# Patient Record
Sex: Female | Born: 1955
Health system: Southern US, Community
[De-identification: ages and names within clinical notes are randomized; demographics above are authoritative.]

## PROBLEM LIST (undated history)

## (undated) HISTORY — PX: TONSILLECTOMY: SUR1361

## (undated) HISTORY — PX: ADENOIDECTOMY: SUR15

---

## 1997-09-21 ENCOUNTER — Other Ambulatory Visit: Admission: RE | Admit: 1997-09-21 | Discharge: 1997-09-21 | Payer: Self-pay | Admitting: Obstetrics and Gynecology

## 2007-07-19 ENCOUNTER — Emergency Department (HOSPITAL_COMMUNITY): Admission: EM | Admit: 2007-07-19 | Discharge: 2007-07-19 | Payer: Self-pay | Admitting: Emergency Medicine

## 2013-01-14 ENCOUNTER — Encounter (HOSPITAL_COMMUNITY): Payer: Self-pay | Admitting: Emergency Medicine

## 2013-01-14 ENCOUNTER — Emergency Department (HOSPITAL_COMMUNITY)
Admission: EM | Admit: 2013-01-14 | Discharge: 2013-01-15 | Disposition: A | Discharge status: Home / Self Care | Payer: 59 | Attending: Emergency Medicine | Admitting: Emergency Medicine

## 2013-01-14 ENCOUNTER — Emergency Department (HOSPITAL_COMMUNITY): Payer: 59

## 2013-01-14 DIAGNOSIS — Z79899 Other long term (current) drug therapy: Secondary | ICD-10-CM | POA: Insufficient documentation

## 2013-01-14 DIAGNOSIS — N10 Acute tubulo-interstitial nephritis: Secondary | ICD-10-CM

## 2013-01-14 DIAGNOSIS — R109 Unspecified abdominal pain: Secondary | ICD-10-CM

## 2013-01-14 DIAGNOSIS — N12 Tubulo-interstitial nephritis, not specified as acute or chronic: Secondary | ICD-10-CM | POA: Insufficient documentation

## 2013-01-14 DIAGNOSIS — R110 Nausea: Secondary | ICD-10-CM | POA: Insufficient documentation

## 2013-01-14 DIAGNOSIS — R011 Cardiac murmur, unspecified: Secondary | ICD-10-CM | POA: Insufficient documentation

## 2013-01-14 LAB — URINALYSIS, ROUTINE W REFLEX MICROSCOPIC
Bilirubin Urine: NEGATIVE
Hgb urine dipstick: NEGATIVE
Specific Gravity, Urine: 1.011 (ref 1.005–1.030)
Urobilinogen, UA: 0.2 mg/dL (ref 0.0–1.0)

## 2013-01-14 LAB — COMPREHENSIVE METABOLIC PANEL
Alkaline Phosphatase: 84 U/L (ref 39–117)
BUN: 13 mg/dL (ref 6–23)
Chloride: 104 mEq/L (ref 96–112)
GFR calc Af Amer: >90 mL/min (ref >90)
GFR calc non Af Amer: >90 mL/min (ref >90)
Glucose, Bld: 95 mg/dL (ref 70–99)
Potassium: 3.6 mEq/L (ref 3.5–5.1)
Total Bilirubin: 0.7 mg/dL (ref 0.3–1.2)
Total Protein: 7.4 g/dL (ref 6.0–8.3)

## 2013-01-14 LAB — CBC WITH DIFFERENTIAL/PLATELET
HCT: 38.2 % (ref 36.0–46.0)
MCV: 82.3 fL (ref 78.0–100.0)
RDW: 13 % (ref 11.5–15.5)
WBC: 5.7 10*3/uL (ref 4.0–10.5)

## 2013-01-14 LAB — URINE MICROSCOPIC-ADD ON

## 2013-01-14 MED ORDER — CIPROFLOXACIN HCL 500 MG PO TABS: 500.0000 mg | ORAL_TABLET | Freq: Two times a day (BID) | ORAL | Status: AC

## 2013-01-14 MED ORDER — DEXTROSE 5 % IV SOLN
1.0000 g | Freq: Once | INTRAVENOUS | Status: AC
  Administered 2013-01-15: 1 g via INTRAVENOUS
  Filled 2013-01-14: qty 10

## 2013-01-14 MED ORDER — ONDANSETRON HCL 4 MG/2ML IJ SOLN
4.0000 mg | Freq: Once | INTRAMUSCULAR | Status: AC
  Administered 2013-01-14: 4 mg via INTRAVENOUS
  Filled 2013-01-14: qty 2

## 2013-01-14 MED ORDER — ONDANSETRON 4 MG PO TBDP: 4.0000 mg | ORAL_TABLET | Freq: Three times a day (TID) | ORAL | Status: AC | PRN

## 2013-01-14 MED ORDER — PROMETHAZINE HCL 25 MG RE SUPP: 25.0000 mg | Freq: Four times a day (QID) | RECTAL | Status: AC | PRN

## 2013-01-14 MED ORDER — MORPHINE SULFATE 2 MG/ML IJ SOLN
2.0000 mg | Freq: Once | INTRAMUSCULAR | Status: AC
  Administered 2013-01-14: 1 mg via INTRAVENOUS
  Filled 2013-01-14: qty 1

## 2013-01-14 MED ORDER — SODIUM CHLORIDE 0.9 % IV BOLUS (SEPSIS)
1000.0000 mL | Freq: Once | INTRAVENOUS | Status: AC
  Administered 2013-01-14: 1000 mL via INTRAVENOUS

## 2013-01-14 MED ORDER — HYDROCODONE-ACETAMINOPHEN 5-325 MG PO TABS: 2.0000 | ORAL_TABLET | ORAL | Status: AC | PRN

## 2013-01-14 MED ORDER — MORPHINE SULFATE 2 MG/ML IJ SOLN
1.0000 mg | Freq: Once | INTRAMUSCULAR | Status: AC
  Administered 2013-01-14: 1 mg via INTRAVENOUS
  Filled 2013-01-14: qty 1

## 2013-01-14 MED ORDER — CIPROFLOXACIN HCL 500 MG PO TABS: 500.0000 mg | ORAL_TABLET | Freq: Two times a day (BID) | ORAL | Status: DC

## 2013-01-14 MED ORDER — KETOROLAC TROMETHAMINE 30 MG/ML IJ SOLN
30.0000 mg | Freq: Once | INTRAMUSCULAR | Status: AC
  Administered 2013-01-15: 30 mg via INTRAVENOUS
  Filled 2013-01-14: qty 1

## 2013-01-14 NOTE — ED Notes (Signed)
Pt presents with c/o lower right side abdominal pain. Pt says the pain started around 11 this morning and it started to back off and now the pain is back. Pt has had some mild nausea but no vomiting. Pt has been able to keep food down.

## 2013-01-14 NOTE — ED Notes (Signed)
Pt aaox3. Pt c/o dull right side abd pain since this morn. Pt c/o nausea.  Pt denies chills or fever

## 2013-01-14 NOTE — ED Provider Notes (Signed)
CSN: [id number]     Arrival date & time 01/14/13  1841 History   First MD Initiated Contact with Patient 01/14/13 1900     Chief Complaint  Patient presents with  . Abdominal Pain   (Consider location/radiation/quality/duration/timing/severity/associated sxs/prior Treatment) HPI Comments: The patient is a 57 year-old female presenting the Emergency Department with a chief complaint of non radiating Right flank/abdominal pain since 1100 today.  She reports an abrupt onset of pain that partially resolved and then worsened.  She reports bending forward aggravates the pain and laying still relieves it. She complains of associated nausea without emesis. The patient denies a history of colonoscopy or abdominal surgeries.  She reports a non-bloody BM today that was normal. She reports she is post-menopausal and denies abnormal vaginal discharge or vaginal bleeding. Denies dysuria, hematuria, fever or chills. No PCP.  The history is provided by the patient. No language interpreter was used.    History reviewed. No pertinent past medical history. Past Surgical History  Procedure Laterality Date  . Tonsillectomy    . Adenoidectomy     No family history on file. History  Substance Use Topics  . Smoking status: Never Smoker   . Smokeless tobacco: Not on file  . Alcohol Use: Yes     Comment: socially    OB History   Grav Para Term Preterm Abortions TAB SAB Ect Mult Living                 Review of Systems  Constitutional: Negative for fever and chills.  Respiratory: Negative for cough.   Cardiovascular: Negative for leg swelling.  Gastrointestinal: Positive for nausea and abdominal pain. Negative for vomiting, diarrhea, constipation, blood in stool and abdominal distention.  Genitourinary: Negative for dysuria, urgency, frequency, hematuria, vaginal bleeding and vaginal discharge.  All other systems reviewed and are negative.    Allergies  Review of patient's allergies indicates no  known allergies.  Home Medications   Current Outpatient Rx  Name  Route  Sig  Dispense  Refill  . b complex vitamins tablet   Oral   Take 1 tablet by mouth daily.         . cholecalciferol (VITAMIN D) 1000 UNITS tablet   Oral   Take 2,000 Units by mouth daily.         . methylphenidate (CONCERTA) 36 MG CR tablet   Oral   Take 36 mg by mouth daily.         . mirtazapine (REMERON) 30 MG tablet   Oral   Take 30 mg by mouth at bedtime.         . ciprofloxacin (CIPRO) 500 MG tablet   Oral   Take 1 tablet (500 mg total) by mouth 2 (two) times daily.   20 tablet   0   . HYDROcodone-acetaminophen (NORCO/VICODIN) 5-325 MG per tablet   Oral   Take 2 tablets by mouth every 4 (four) hours as needed.   10 tablet   0   . ondansetron (ZOFRAN ODT) 4 MG disintegrating tablet   Oral   Take 1 tablet (4 mg total) by mouth every 8 (eight) hours as needed for nausea or vomiting.   20 tablet   0   . promethazine (PHENERGAN) 25 MG suppository   Rectal   Place 1 suppository (25 mg total) rectally every 6 (six) hours as needed for nausea or vomiting.   12 each   0    BP 135/75  Pulse 61  Temp(Src) 97.8 F (36.6 C) (Oral)  Resp 16  SpO2 93% Physical Exam  Nursing note and vitals reviewed. Constitutional: Vital signs are normal. She appears well-developed and well-nourished.  HENT:  Head: Normocephalic and atraumatic.  Eyes: No scleral icterus.  Neck: Neck supple.  Cardiovascular: Normal rate and regular rhythm.   Murmur heard.  Systolic murmur is present with a grade of 2/6  Pulmonary/Chest: Effort normal and breath sounds normal. She has no wheezes. She has no rhonchi. She has no rales. She exhibits no tenderness.  Abdominal: Soft. Normal appearance and bowel sounds are normal. She exhibits no mass. There is tenderness in the right lower quadrant. There is no rebound, no guarding, no CVA tenderness, no tenderness at McBurney's point and negative Murphy's sign.     Minimal tenderness to RLQ  Neurological: She is alert.  Skin: Skin is warm and dry. She is not diaphoretic.    ED Course  Procedures (including critical care time) Labs Review Labs Reviewed  URINALYSIS, ROUTINE W REFLEX MICROSCOPIC - Abnormal; Notable for the following:    APPearance CLOUDY (*)    Leukocytes, UA MODERATE (*)    All other components within normal limits  URINE CULTURE  CBC WITH DIFFERENTIAL  COMPREHENSIVE METABOLIC PANEL  URINE MICROSCOPIC-ADD ON   Imaging Review Ct Abdomen Pelvis Wo Contrast  01/14/2013   CLINICAL DATA:  Sudden onset of right lower quadrant abdominal pain and right flank pain.  EXAM: CT ABDOMEN AND PELVIS WITHOUT CONTRAST  TECHNIQUE: Multidetector CT imaging of the abdomen and pelvis was performed following the standard protocol without intravenous contrast.  COMPARISON:  None.  FINDINGS: The visualized lung bases are clear.  The liver and spleen are unremarkable in appearance. The gallbladder is within normal limits. The pancreas and adrenal glands are unremarkable.  The kidneys are unremarkable in appearance. There is no evidence of hydronephrosis. No renal or ureteral stones are seen. No perinephric stranding is appreciated.  No free fluid is identified. The small bowel is unremarkable in appearance. The stomach is within normal limits. No acute vascular abnormalities are seen. Scattered calcification is noted along the abdominal aorta and its branches.  The appendix is normal in caliber, without evidence for appendicitis. The colon is unremarkable in appearance.  The bladder is moderately distended and grossly unremarkable in appearance. The uterus is within normal limits. The ovaries are relatively symmetric; no suspicious adnexal masses are seen. No inguinal lymphadenopathy is seen.  No acute osseous abnormalities are identified.  IMPRESSION: 1. No acute abnormality seen within the abdomen or pelvis. No evidence of hydronephrosis; no renal or ureteral  stones seen. 2. Scattered calcification along the abdominal aorta and its branches.   Electronically Signed   By: Roanna Raider M.D.   On: 01/14/2013 23:03    EKG Interpretation   None       MDM   1. Abdominal pain   2. Pyelonephritis, acute    Pt with a 1 day history of right sided abdominal pain and associated nausea.  Mild tenderness on exam.  Will rule out kidney stone, urinary tract infection.  CBC and CMP unremarkable.  UA-Moderate Leukocytes. Discussed patient history and condition with Dr. Denton Lank, after his evaluation of the patient suggest ordering a CT to evaluate etiology of pain.  And to treat for pyelonephritis. Pt reports nausea has improved, only agreed to take 1mg  of Morphine and requesting the rest of the medication at this time.  CT-without obvious cause of pain. Discussed lab results, imaging  results, and treatment plan with the patient.  She reports understanding and no other concerns at this time.  Patient is stable for discharge at this time.    Clabe Seal, PA-C 01/15/13 [phone]

## 2013-01-15 NOTE — ED Notes (Signed)
Pt verbalizes understanding 

## 2013-01-16 LAB — URINE CULTURE

## 2013-01-16 NOTE — ED Provider Notes (Signed)
Medical screening examination/treatment/procedure(s) were conducted as a shared visit with non-physician practitioner(s) and myself.  I personally evaluated the patient during the encounter.  EKG Interpretation   None       Pt c/o acute onset right flank pain, constant. No hx same pain. Ct neg for stone. ua w le pos, nit pos, several wbc, will cx and rx.   Suzi Roots, MD 01/16/13 [phone]

## 2015-02-16 MED FILL — MIRTAZAPINE 30 MG ODT: 30 | 90 days supply | Qty: 90 | Fill #0

## 2015-04-24 MED FILL — METHYLPHENIDATE ER 36 MG TA: 36 | 90 days supply | Qty: 90 | Fill #0

## 2015-05-31 MED FILL — MIRTAZAPINE 30 MG ODT: 30 | 90 days supply | Qty: 90 | Fill #1

## 2015-08-07 MED FILL — METHYLPHENIDATE ER 36 MG TA: 36 | 90 days supply | Qty: 90 | Fill #0

## 2015-09-10 MED FILL — MIRTAZAPINE 30 MG ODT: 30 | 90 days supply | Qty: 90 | Fill #0

## 2015-11-22 ENCOUNTER — Ambulatory Visit (INDEPENDENT_AMBULATORY_CARE_PROVIDER_SITE_OTHER): Payer: 59 | Admitting: Family Medicine

## 2015-11-22 ENCOUNTER — Encounter: Payer: Self-pay | Admitting: Family Medicine

## 2015-11-22 ENCOUNTER — Ambulatory Visit (HOSPITAL_BASED_OUTPATIENT_CLINIC_OR_DEPARTMENT_OTHER)
Admission: RE | Admit: 2015-11-22 | Discharge: 2015-11-22 | Disposition: A | Discharge status: Home / Self Care | Payer: 59 | Source: Ambulatory Visit | Attending: Family Medicine | Admitting: Family Medicine

## 2015-11-22 VITALS — Ht 64.0 in | Wt 134.0 lb

## 2015-11-22 DIAGNOSIS — S99921A Unspecified injury of right foot, initial encounter: Secondary | ICD-10-CM

## 2015-11-22 DIAGNOSIS — W172XXA Fall into hole, initial encounter: Secondary | ICD-10-CM | POA: Insufficient documentation

## 2015-11-22 DIAGNOSIS — S99911A Unspecified injury of right ankle, initial encounter: Secondary | ICD-10-CM

## 2015-11-22 DIAGNOSIS — M2011 Hallux valgus (acquired), right foot: Secondary | ICD-10-CM | POA: Insufficient documentation

## 2015-11-22 DIAGNOSIS — M25571 Pain in right ankle and joints of right foot: Secondary | ICD-10-CM | POA: Diagnosis not present

## 2015-11-22 NOTE — Patient Instructions (Addendum)
You have an ankle sprain. Ice the area for 15 minutes at a time, 3-4 times a day Aleve 2 tabs twice a day with food OR ibuprofen 3 tabs three times a day with food for pain and inflammation. Elevate above the level of your heart when possible Crutches if needed to help with walking Bear weight when tolerated Use ankle brace to help with stability while you recover from this injury. Come out of the brace twice a day to do Up/down and alphabet exercises 2-3 sets of each. Start theraband strengthening exercises in 2 weeks - once a day 3 sets of 10. Consider physical therapy for strengthening and balance exercises. If not improving as expected, we may repeat x-rays or consider further testing like an MRI. Follow up with me in 2 weeks if you're doing really well, 4 weeks at the latest.

## 2015-11-25 DIAGNOSIS — S99911D Unspecified injury of right ankle, subsequent encounter: Secondary | ICD-10-CM | POA: Insufficient documentation

## 2015-11-25 NOTE — Progress Notes (Signed)
PCP: No primary care provider on file.  Subjective:   HPI: Patient is a 60 y.o. female here for right ankle injury.  Patient reports on 10/7 she stepped in a hole and inverted her right ankle. Immediate pain but didn't hear or feel a pop. Has been icing, elevating, taking ibuprofen, and using an ACE wrap. Slight swelling. Was able to bear weight initially. Using crutches. Pain is medial and lateral 1/10, soreness No skin changes, numbness.  No past medical history on file.  Current Outpatient Prescriptions on File Prior to Visit  Medication Sig Dispense Refill  . b complex vitamins tablet Take 1 tablet by mouth daily.    . cholecalciferol (VITAMIN D) 1000 UNITS tablet Take 2,000 Units by mouth daily.    . ciprofloxacin (CIPRO) 500 MG tablet Take 1 tablet (500 mg total) by mouth 2 (two) times daily. 20 tablet 0  . HYDROcodone-acetaminophen (NORCO/VICODIN) 5-325 MG per tablet Take 2 tablets by mouth every 4 (four) hours as needed. 10 tablet 0  . methylphenidate (CONCERTA) 36 MG CR tablet Take 36 mg by mouth daily.    . mirtazapine (REMERON) 30 MG tablet Take 30 mg by mouth at bedtime.    . ondansetron (ZOFRAN ODT) 4 MG disintegrating tablet Take 1 tablet (4 mg total) by mouth every 8 (eight) hours as needed for nausea or vomiting. 20 tablet 0  . promethazine (PHENERGAN) 25 MG suppository Place 1 suppository (25 mg total) rectally every 6 (six) hours as needed for nausea or vomiting. 12 each 0   No current facility-administered medications on file prior to visit.     Past Surgical History:  Procedure Laterality Date  . ADENOIDECTOMY    . TONSILLECTOMY      No Known Allergies  Social History   Social History  . Marital status: Single    Spouse name: N/A  . Number of children: N/A  . Years of education: N/A   Occupational History  . Not on file.   Social History Main Topics  . Smoking status: Never Smoker  . Smokeless tobacco: Never Used  . Alcohol use Yes   Comment: socially   . Drug use: No  . Sexual activity: Not on file   Other Topics Concern  . Not on file   Social History Narrative  . No narrative on file    No family history on file.  Ht 5\' 4"  (1.626 m)   Wt 134 lb (60.8 kg)   BMI 23.00 kg/m   Review of Systems: See HPI above.    Objective:  Physical Exam:  Gen: NAD, comfortable in exam room  Right ankle: Mild swelling, bruising medially and laterally.  No other deformity. Mild limitation motion all directions. TTP over deltoid ligament and ATFL.  No malleolar, base 5th, navicular tenderness.  No fibular tenderness. 1+ ant drawer.  Pain with talar and reverse talar tilt also. Negative syndesmotic compression. Thompsons test negative. NV intact distally.  Left ankle: FROM without pain.    Assessment & Plan:  1. Right ankle injury - exam reassuring.  Independently reviewed radiographs and no fracture.  Also brief MSK u/s confirmed no abnormalities of either malleoli.  2/2 sprain.  Icing, nsaids, elevation.  Shown home exercises to do daily.  She has an aircast brace - use regularly for support.  Start theraband strengthening exercises in 2 weeks.  Consider physical therapy.  F/u in 2-4 weeks.

## 2015-11-25 NOTE — Assessment & Plan Note (Signed)
exam reassuring.  Independently reviewed radiographs and no fracture.  Also brief MSK u/s confirmed no abnormalities of either malleoli.  2/2 sprain.  Icing, nsaids, elevation.  Shown home exercises to do daily.  She has an aircast brace - use regularly for support.  Start theraband strengthening exercises in 2 weeks.  Consider physical therapy.  F/u in 2-4 weeks.

## 2015-11-28 DIAGNOSIS — I10 Essential (primary) hypertension: Secondary | ICD-10-CM | POA: Diagnosis not present

## 2015-11-28 MED FILL — LOSARTAN POTASSIUM 50 MG TA: 50 | 60 days supply | Qty: 60 | Fill #0

## 2015-12-11 ENCOUNTER — Ambulatory Visit (INDEPENDENT_AMBULATORY_CARE_PROVIDER_SITE_OTHER): Payer: 59 | Admitting: Family Medicine

## 2015-12-11 ENCOUNTER — Encounter: Payer: Self-pay | Admitting: Family Medicine

## 2015-12-11 DIAGNOSIS — S99911D Unspecified injury of right ankle, subsequent encounter: Secondary | ICD-10-CM

## 2015-12-11 NOTE — Progress Notes (Signed)
PCP: No primary care provider on file.  Subjective:   HPI: Patient is a [age] y.o. female here for right ankle injury.  10/12: Patient reports on 10/7 she stepped in a hole and inverted her right ankle. Immediate pain but didn't hear or feel a pop. Has been icing, elevating, taking ibuprofen, and using an ACE wrap. Slight swelling. Was able to bear weight initially. Using crutches. Pain is medial and lateral 1/10, soreness No skin changes, numbness.  10/31: Patient reports she feels great. No longer using a brace. Doing home exercises. Some mild pain with extreme limits of motion. No pain currently. No skin changes, numbness.  No past medical history on file.  Current Outpatient Prescriptions on File Prior to Visit  Medication Sig Dispense Refill  . b complex vitamins tablet Take 1 tablet by mouth daily.    . cholecalciferol (VITAMIN D) 1000 UNITS tablet Take 2,000 Units by mouth daily.    . ciprofloxacin (CIPRO) 500 MG tablet Take 1 tablet (500 mg total) by mouth 2 (two) times daily. 20 tablet 0  . HYDROcodone-acetaminophen (NORCO/VICODIN) 5-325 MG per tablet Take 2 tablets by mouth every 4 (four) hours as needed. 10 tablet 0  . methylphenidate (CONCERTA) 36 MG CR tablet Take 36 mg by mouth daily.    . mirtazapine (REMERON) 30 MG tablet Take 30 mg by mouth at bedtime.    . ondansetron (ZOFRAN ODT) 4 MG disintegrating tablet Take 1 tablet (4 mg total) by mouth every 8 (eight) hours as needed for nausea or vomiting. 20 tablet 0  . promethazine (PHENERGAN) 25 MG suppository Place 1 suppository (25 mg total) rectally every 6 (six) hours as needed for nausea or vomiting. 12 each 0   No current facility-administered medications on file prior to visit.     Past Surgical History:  Procedure Laterality Date  . ADENOIDECTOMY    . TONSILLECTOMY      No Known Allergies  Social History   Social History  . Marital status: Single    Spouse name: N/A  . Number of children: N/A  .  Years of education: N/A   Occupational History  . Not on file.   Social History Main Topics  . Smoking status: Never Smoker  . Smokeless tobacco: Never Used  . Alcohol use Yes     Comment: socially   . Drug use: No  . Sexual activity: Not on file   Other Topics Concern  . Not on file   Social History Narrative  . No narrative on file    No family history on file.  BP 124/77   Pulse 67   Ht 5\' 4"  (1.626 m)   Wt 136 lb (61.7 kg)   BMI 23.34 kg/m   Review of Systems: See HPI above.    Objective:  Physical Exam:  Gen: NAD, comfortable in exam room  Right ankle: No swelling, bruising, other deformity. FROM. No TTP. Negative ant drawer, talar tilt. Negative syndesmotic compression. Thompsons test negative. NV intact distally.  Left ankle: FROM without pain.    Assessment & Plan:  1. Right ankle injury - Clinically improved from ankle sprain.  Continue home exercises.  ASO only as tolerated.  Icing, nsaids only if needed.  F/u prn.

## 2015-12-11 NOTE — Assessment & Plan Note (Signed)
Clinically improved from ankle sprain.  Continue home exercises.  ASO only as tolerated.  Icing, nsaids only if needed.  F/u prn.

## 2015-12-18 MED FILL — MIRTAZAPINE 30 MG ODT: 30 | 90 days supply | Qty: 90 | Fill #1

## 2015-12-18 MED FILL — METHYLPHENIDATE ER 36 MG TA: 36 | 90 days supply | Qty: 90 | Fill #0

## 2015-12-26 DIAGNOSIS — I10 Essential (primary) hypertension: Secondary | ICD-10-CM | POA: Diagnosis not present

## 2016-01-28 MED FILL — LOSARTAN POTASSIUM 50 MG TA: 50 | 90 days supply | Qty: 90 | Fill #0

## 2016-03-21 MED FILL — MIRTAZAPINE 30 MG ODT: 30 | 90 days supply | Qty: 90 | Fill #0

## 2016-04-25 MED FILL — LOSARTAN POTASSIUM 50 MG TA: 50 | 90 days supply | Qty: 90 | Fill #1

## 2016-05-02 MED FILL — CONCERTA 36 MG TABLET ER: 36 | 90 days supply | Qty: 90 | Fill #0

## 2016-06-16 DIAGNOSIS — Z131 Encounter for screening for diabetes mellitus: Secondary | ICD-10-CM | POA: Diagnosis not present

## 2016-06-16 DIAGNOSIS — I10 Essential (primary) hypertension: Secondary | ICD-10-CM | POA: Diagnosis not present

## 2016-06-16 DIAGNOSIS — F988 Other specified behavioral and emotional disorders with onset usually occurring in childhood and adolescence: Secondary | ICD-10-CM | POA: Diagnosis not present

## 2016-06-16 DIAGNOSIS — Z0001 Encounter for general adult medical examination with abnormal findings: Secondary | ICD-10-CM | POA: Diagnosis not present

## 2016-06-16 DIAGNOSIS — Z13220 Encounter for screening for lipoid disorders: Secondary | ICD-10-CM | POA: Diagnosis not present

## 2016-06-16 DIAGNOSIS — Z0184 Encounter for antibody response examination: Secondary | ICD-10-CM | POA: Diagnosis not present

## 2016-06-16 DIAGNOSIS — Z1211 Encounter for screening for malignant neoplasm of colon: Secondary | ICD-10-CM | POA: Diagnosis not present

## 2016-06-16 DIAGNOSIS — Z124 Encounter for screening for malignant neoplasm of cervix: Secondary | ICD-10-CM | POA: Diagnosis not present

## 2016-07-01 MED FILL — MIRTAZAPINE 30 MG ODT: 30 | 90 days supply | Qty: 90 | Fill #1

## 2016-07-22 MED FILL — LOSARTAN POTASSIUM 50 MG TA: 50 | 90 days supply | Qty: 90 | Fill #2

## 2016-09-03 MED FILL — CONCERTA 36 MG TABLET ER: 36 | 90 days supply | Qty: 90 | Fill #0

## 2016-10-09 MED FILL — MIRTAZAPINE 30 MG ODT: 30 | 90 days supply | Qty: 90 | Fill #0

## 2016-10-09 MED FILL — LOSARTAN POTASSIUM 50 MG TA: 50 | 90 days supply | Qty: 90 | Fill #3

## 2016-12-15 DIAGNOSIS — E785 Hyperlipidemia, unspecified: Secondary | ICD-10-CM | POA: Diagnosis not present

## 2016-12-15 DIAGNOSIS — I10 Essential (primary) hypertension: Secondary | ICD-10-CM | POA: Diagnosis not present

## 2016-12-15 DIAGNOSIS — Z1211 Encounter for screening for malignant neoplasm of colon: Secondary | ICD-10-CM | POA: Diagnosis not present

## 2017-01-13 MED FILL — MIRTAZAPINE 30 MG ODT: 30 | 90 days supply | Qty: 90 | Fill #1

## 2017-01-14 MED FILL — LOSARTAN POTASSIUM 50 MG TA: 50 | 90 days supply | Qty: 90 | Fill #0

## 2017-01-14 MED FILL — CONCERTA 36 MG TABLET ER: 36 | 90 days supply | Qty: 90 | Fill #0

## 2017-01-26 DIAGNOSIS — B029 Zoster without complications: Secondary | ICD-10-CM | POA: Diagnosis not present

## 2017-01-26 MED FILL — predniSONE 10 MG TABS: 10 | 12 days supply | Qty: 48 | Fill #0

## 2017-01-26 MED FILL — TRIAMCINOLONE 0.1% CREAM: 0.1 | 15 days supply | Qty: 60 | Fill #0

## 2017-01-26 MED FILL — valACYclovir HCL 1 GM TABS: 1 | 10 days supply | Qty: 30 | Fill #0

## 2017-01-30 MED FILL — TRIAMCINOLONE 0.1% CREAM: 0.1 | 14 days supply | Qty: 60 | Fill #0

## 2017-02-13 MED FILL — valACYclovir HCL 1 GM TABS: 1 | 10 days supply | Qty: 30 | Fill #0

## 2017-04-13 MED FILL — LOSARTAN POTASSIUM 50 MG TA: 50 | 90 days supply | Qty: 90 | Fill #1

## 2017-04-14 IMAGING — CR DG ANKLE COMPLETE 3+V*R*
3 series · 3 of 3 positions shown · non-contrast
Comparison: None.

CLINICAL DATA: Patient fell in hole.  Rolling type injury with pain

EXAM:
RIGHT ANKLE - COMPLETE 3+ VIEW

[t ankle joint ap right]
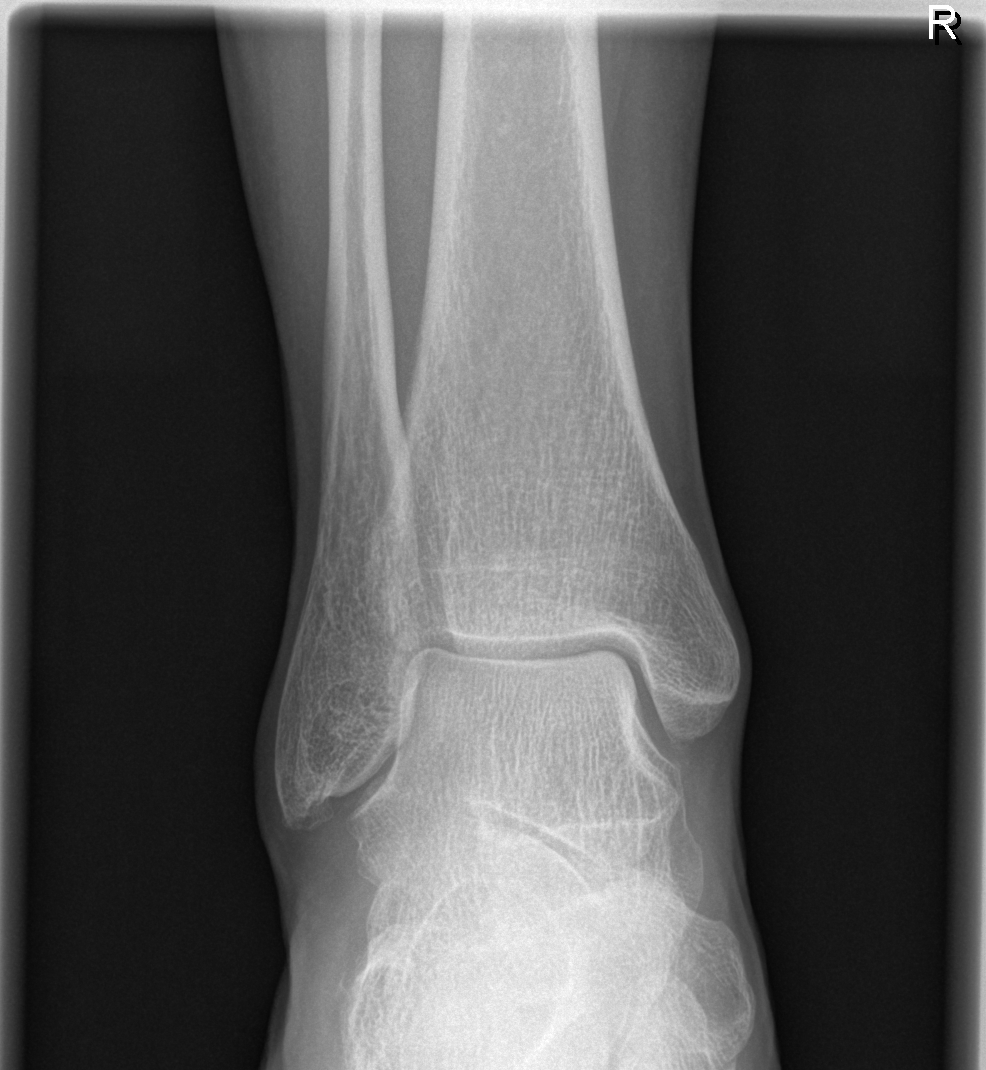

[t ankle joint oblique right]
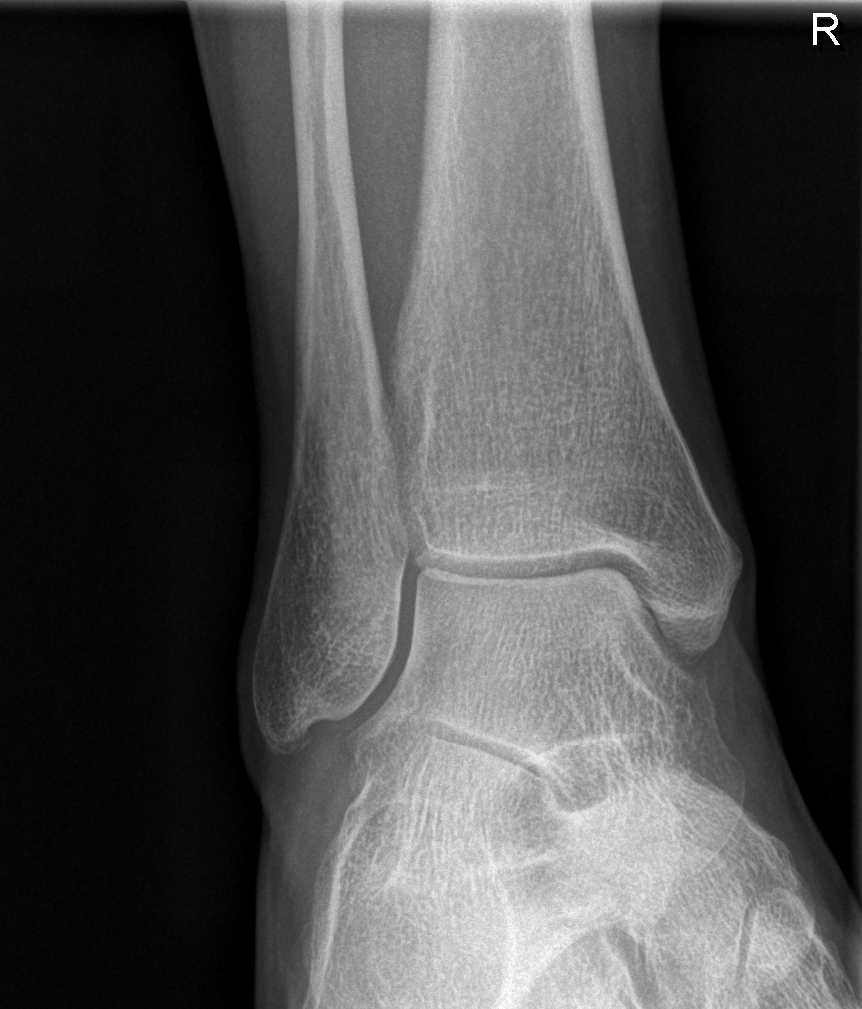

[t ankle joint lat right]
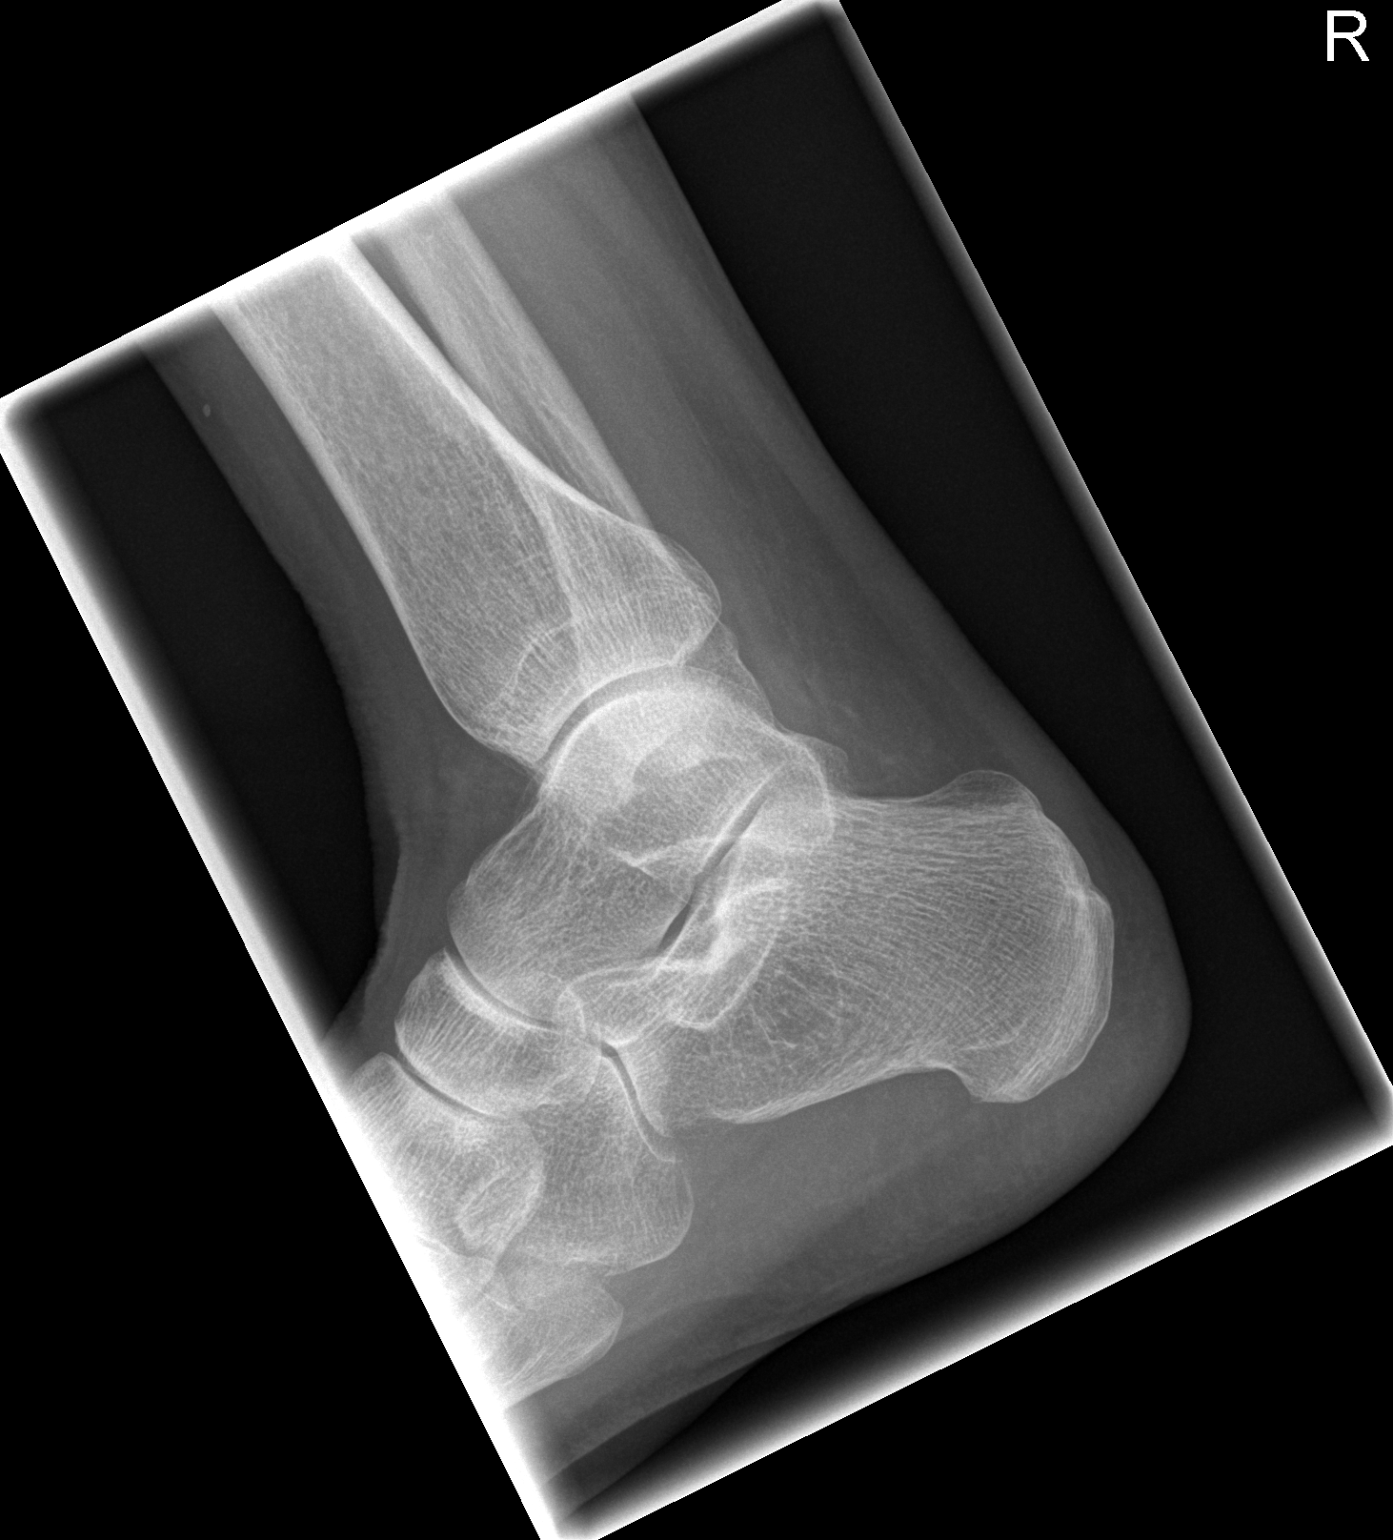

[3 of 3 positions shown; findings below may reference images not displayed]

FINDINGS: Frontal, oblique, and lateral views were obtained. A tiny
calcification in the medial malleolus raises concern for small
avulsion injury. No other evidence suggesting fracture. The ankle
mortise appears intact. No appreciable joint space narrowing.
IMPRESSION: Tiny calcification in the medial malleolus raises concern for small
avulsion injury. No other evidence suggesting fracture. No joint
effusion. Ankle mortise appears intact.

## 2017-04-14 MED FILL — MIRTAZAPINE 30 MG ODT: 30 | 90 days supply | Qty: 90 | Fill #0

## 2017-06-08 MED FILL — CONCERTA 36 MG TABLET ER: 36 | 90 days supply | Qty: 90 | Fill #0

## 2017-07-13 DIAGNOSIS — I10 Essential (primary) hypertension: Secondary | ICD-10-CM | POA: Diagnosis not present

## 2017-07-13 DIAGNOSIS — Z1211 Encounter for screening for malignant neoplasm of colon: Secondary | ICD-10-CM | POA: Diagnosis not present

## 2017-07-13 DIAGNOSIS — Z0000 Encounter for general adult medical examination without abnormal findings: Secondary | ICD-10-CM | POA: Diagnosis not present

## 2017-07-13 DIAGNOSIS — F988 Other specified behavioral and emotional disorders with onset usually occurring in childhood and adolescence: Secondary | ICD-10-CM | POA: Diagnosis not present

## 2017-07-13 DIAGNOSIS — E785 Hyperlipidemia, unspecified: Secondary | ICD-10-CM | POA: Diagnosis not present

## 2017-07-13 DIAGNOSIS — Z131 Encounter for screening for diabetes mellitus: Secondary | ICD-10-CM | POA: Diagnosis not present

## 2017-07-13 MED FILL — LOSARTAN POTASSIUM 50 MG TA: 50 | 30 days supply | Qty: 30 | Fill #2

## 2017-07-21 MED FILL — MIRTAZAPINE 30 MG ODT: 30 | 90 days supply | Qty: 90 | Fill #1

## 2017-08-11 MED FILL — LOSARTAN POTASSIUM 50 MG TA: 50 | 90 days supply | Qty: 90 | Fill #3

## 2017-10-14 MED FILL — CONCERTA 36 MG TABLET ER: 36 | 90 days supply | Qty: 90 | Fill #0

## 2017-10-14 MED FILL — MIRTAZAPINE 30 MG TBDP: 30 | 90 days supply | Qty: 90 | Fill #0

## 2017-11-09 MED FILL — LOSARTAN POTASSIUM 50 MG TA: 50 | 30 days supply | Qty: 30 | Fill #4

## 2017-12-08 MED FILL — LOSARTAN POTASSIUM 50 MG TA: 50 | 30 days supply | Qty: 30 | Fill #5

## 2017-12-29 MED FILL — valACYclovir HCL 1 GM TABS: 1 | 30 days supply | Qty: 30 | Fill #0

## 2018-01-14 MED FILL — LOSARTAN POTASSIUM 50 MG TA: 50 | 30 days supply | Qty: 30 | Fill #0

## 2018-01-29 MED FILL — MIRTAZAPINE 30 MG TBDP: 30 | 90 days supply | Qty: 90 | Fill #1

## 2018-02-12 MED FILL — LOSARTAN POTASSIUM 50 MG TA: 50 | 30 days supply | Qty: 30 | Fill #1

## 2018-02-22 MED FILL — CONCERTA 36 MG TABLET ER: 36 | 90 days supply | Qty: 90 | Fill #0

## 2018-03-11 MED FILL — LOSARTAN POTASSIUM 50 MG TA: 50 | 30 days supply | Qty: 30 | Fill #2

## 2018-04-12 MED FILL — LOSARTAN POTASSIUM 50 MG TA: 50 | 30 days supply | Qty: 30 | Fill #3

## 2018-05-07 MED FILL — LOSARTAN POTASSIUM 50 MG TA: 50 | 30 days supply | Qty: 30 | Fill #4

## 2018-05-19 MED FILL — MIRTAZAPINE 30 MG TABLET: 30 | 90 days supply | Qty: 90 | Fill #0

## 2018-06-10 MED FILL — LOSARTAN POTASSIUM 50 MG TA: 50 | 30 days supply | Qty: 30 | Fill #5

## 2018-07-02 MED FILL — CONCERTA 36 MG TABLET ER: 36 | 90 days supply | Qty: 90 | Fill #0

## 2018-07-19 MED FILL — LOSARTAN POTASSIUM 50 MG TA: 50 | 30 days supply | Qty: 30 | Fill #6

## 2018-08-19 MED FILL — LOSARTAN POTASSIUM 50 MG TA: 50 | 30 days supply | Qty: 30 | Fill #7

## 2018-08-19 MED FILL — MIRTAZAPINE 30 MG TABLET: 30 | 90 days supply | Qty: 90 | Fill #1

## 2018-09-21 MED FILL — LOSARTAN POTASSIUM 50 MG TA: 50 | 30 days supply | Qty: 30 | Fill #8

## 2018-10-19 MED FILL — LOSARTAN POTASSIUM 50 MG TA: 50 | 30 days supply | Qty: 30 | Fill #9

## 2018-11-08 MED FILL — CONCERTA 36 MG TABLET ER: 36 | 90 days supply | Qty: 90 | Fill #0

## 2018-11-16 MED FILL — MIRTAZAPINE 30 MG TABLET: 30 | 90 days supply | Qty: 90 | Fill #2

## 2018-11-23 MED FILL — LOSARTAN POTASSIUM 50 MG TA: 50 | 30 days supply | Qty: 30 | Fill #10

## 2018-12-23 MED FILL — LOSARTAN POTASSIUM 50 MG TA: 50 | 30 days supply | Qty: 30 | Fill #11

## 2019-01-26 MED FILL — LOSARTAN POTASSIUM 50 MG TA: 50 | 90 days supply | Qty: 90 | Fill #0

## 2019-02-15 MED FILL — MIRTAZAPINE 30 MG TBDP: 30 | 90 days supply | Qty: 90 | Fill #0

## 2019-03-18 MED FILL — CONCERTA 36 MG TABLET ER: 36 | 90 days supply | Qty: 90 | Fill #0

## 2019-05-09 MED FILL — LOSARTAN POTASSIUM 50 MG TA: 50 | 90 days supply | Qty: 90 | Fill #1

## 2019-05-16 MED FILL — MIRTAZAPINE 30 MG TBDP: 30 | 90 days supply | Qty: 90 | Fill #1

## 2019-08-05 MED FILL — LOSARTAN POTASSIUM 50 MG TA: 50 | 90 days supply | Qty: 90 | Fill #2

## 2019-08-16 MED FILL — MIRTAZAPINE 30 MG TBDP: 30 | 90 days supply | Qty: 90 | Fill #2

## 2019-11-05 MED FILL — LOSARTAN POTASSIUM 50 MG TA: 50 | 90 days supply | Qty: 90 | Fill #3

## 2019-11-17 ENCOUNTER — Other Ambulatory Visit (HOSPITAL_BASED_OUTPATIENT_CLINIC_OR_DEPARTMENT_OTHER): Payer: Self-pay | Admitting: Adult Health Nurse Practitioner

## 2019-11-17 MED FILL — MIRTAZAPINE 30 MG TBDP: 30 | 90 days supply | Qty: 90 | Fill #0

## 2019-12-06 MED FILL — CONCERTA 36 MG TABLET ER: 36 | 90 days supply | Qty: 90 | Fill #0

## 2020-02-06 ENCOUNTER — Other Ambulatory Visit (HOSPITAL_COMMUNITY): Payer: Self-pay | Admitting: Nurse Practitioner

## 2020-02-06 MED FILL — LOSARTAN POTASSIUM 50 MG TA: 50 | 30 days supply | Qty: 30 | Fill #0

## 2020-02-16 MED FILL — MIRTAZAPINE 30 MG TBDP: 30 | 90 days supply | Qty: 90 | Fill #1

## 2020-03-05 MED FILL — LOSARTAN POTASSIUM 50 MG TA: 50 | 30 days supply | Qty: 30 | Fill #1

## 2020-03-14 MED FILL — LOSARTAN POTASSIUM 50 MG TA: 50 | 90 days supply | Qty: 90 | Fill #1

## 2020-04-10 ENCOUNTER — Other Ambulatory Visit (HOSPITAL_BASED_OUTPATIENT_CLINIC_OR_DEPARTMENT_OTHER): Payer: Self-pay | Admitting: Adult Health Nurse Practitioner

## 2020-04-20 MED FILL — CONCERTA 36 MG TABLET ER: 36 | 90 days supply | Qty: 90 | Fill #0

## 2020-05-01 ENCOUNTER — Other Ambulatory Visit (HOSPITAL_BASED_OUTPATIENT_CLINIC_OR_DEPARTMENT_OTHER): Payer: Self-pay

## 2020-05-17 ENCOUNTER — Other Ambulatory Visit (HOSPITAL_COMMUNITY): Payer: Self-pay

## 2020-05-17 MED FILL — Mirtazapine Orally Disintegrating Tab 30 MG: ORAL | 90 days supply | Qty: 90 | Fill #0 | Status: AC

## 2020-05-18 ENCOUNTER — Other Ambulatory Visit (HOSPITAL_COMMUNITY): Payer: Self-pay

## 2020-05-21 ENCOUNTER — Other Ambulatory Visit (HOSPITAL_COMMUNITY): Payer: Self-pay

## 2020-06-12 ENCOUNTER — Other Ambulatory Visit (HOSPITAL_COMMUNITY): Payer: Self-pay

## 2020-06-12 MED FILL — Losartan Potassium Tab 50 MG: ORAL | 90 days supply | Qty: 90 | Fill #0 | Status: AC

## 2020-08-17 ENCOUNTER — Other Ambulatory Visit (HOSPITAL_COMMUNITY): Payer: Self-pay

## 2020-08-22 ENCOUNTER — Other Ambulatory Visit (HOSPITAL_COMMUNITY): Payer: Self-pay

## 2020-08-22 MED ORDER — MIRTAZAPINE 30 MG PO TBDP
30.0000 mg | ORAL_TABLET | Freq: Every evening | ORAL | 3 refills | Status: DC
  Filled 2020-08-22: qty 90, 90d supply, fill #0
  Filled 2020-11-20: qty 90, 90d supply, fill #1
  Filled 2021-02-20: qty 90, 90d supply, fill #2
  Filled 2021-05-02: qty 90, 90d supply, fill #3

## 2020-08-23 ENCOUNTER — Other Ambulatory Visit (HOSPITAL_COMMUNITY): Payer: Self-pay

## 2020-08-30 ENCOUNTER — Other Ambulatory Visit (HOSPITAL_BASED_OUTPATIENT_CLINIC_OR_DEPARTMENT_OTHER): Payer: Self-pay

## 2020-08-30 MED ORDER — METHYLPHENIDATE HCL ER (OSM) 36 MG PO TBCR
EXTENDED_RELEASE_TABLET | ORAL | 0 refills | Status: AC
  Filled 2020-08-30: qty 90, 90d supply, fill #0

## 2020-09-10 ENCOUNTER — Other Ambulatory Visit (HOSPITAL_COMMUNITY): Payer: Self-pay

## 2020-09-10 MED FILL — Losartan Potassium Tab 50 MG: ORAL | 90 days supply | Qty: 90 | Fill #1 | Status: AC

## 2020-11-20 ENCOUNTER — Other Ambulatory Visit (HOSPITAL_COMMUNITY): Payer: Self-pay

## 2020-11-22 ENCOUNTER — Other Ambulatory Visit (HOSPITAL_COMMUNITY): Payer: Self-pay

## 2020-12-03 ENCOUNTER — Other Ambulatory Visit (HOSPITAL_COMMUNITY): Payer: Self-pay

## 2020-12-03 MED FILL — Losartan Potassium Tab 50 MG: ORAL | 90 days supply | Qty: 90 | Fill #2 | Status: CN

## 2020-12-04 ENCOUNTER — Other Ambulatory Visit (HOSPITAL_COMMUNITY): Payer: Self-pay

## 2020-12-04 MED ORDER — LOSARTAN POTASSIUM 50 MG PO TABS
50.0000 mg | ORAL_TABLET | Freq: Every day | ORAL | 0 refills | Status: AC
  Filled 2020-12-04: qty 90, 90d supply, fill #0

## 2021-01-08 ENCOUNTER — Other Ambulatory Visit (HOSPITAL_BASED_OUTPATIENT_CLINIC_OR_DEPARTMENT_OTHER): Payer: Self-pay

## 2021-01-08 MED ORDER — METHYLPHENIDATE HCL ER (OSM) 36 MG PO TBCR
36.0000 mg | EXTENDED_RELEASE_TABLET | Freq: Every morning | ORAL | 0 refills | Status: AC
  Filled 2021-01-08: qty 90, 90d supply, fill #0

## 2021-02-15 ENCOUNTER — Other Ambulatory Visit (HOSPITAL_COMMUNITY): Payer: Self-pay

## 2021-02-15 MED ORDER — MELOXICAM 7.5 MG PO TABS
ORAL_TABLET | ORAL | 1 refills | Status: AC
  Filled 2021-02-15: qty 60, 30d supply, fill #0
  Filled 2021-03-28: qty 60, 30d supply, fill #1

## 2021-02-20 ENCOUNTER — Other Ambulatory Visit (HOSPITAL_COMMUNITY): Payer: Self-pay

## 2021-02-21 ENCOUNTER — Other Ambulatory Visit (HOSPITAL_COMMUNITY): Payer: Self-pay

## 2021-02-24 ENCOUNTER — Other Ambulatory Visit: Payer: Self-pay | Admitting: Family Medicine

## 2021-02-24 DIAGNOSIS — Z1231 Encounter for screening mammogram for malignant neoplasm of breast: Secondary | ICD-10-CM

## 2021-02-27 ENCOUNTER — Other Ambulatory Visit (HOSPITAL_COMMUNITY): Payer: Self-pay

## 2021-02-27 MED ORDER — LOSARTAN POTASSIUM-HCTZ 50-12.5 MG PO TABS
ORAL_TABLET | ORAL | 2 refills | Status: AC
  Filled 2021-02-27: qty 90, 90d supply, fill #0
  Filled 2021-05-24: qty 90, 90d supply, fill #1
  Filled 2021-08-26: qty 90, 90d supply, fill #2

## 2021-03-28 ENCOUNTER — Other Ambulatory Visit (HOSPITAL_COMMUNITY): Payer: Self-pay

## 2021-05-02 ENCOUNTER — Other Ambulatory Visit (HOSPITAL_COMMUNITY): Payer: Self-pay

## 2021-05-03 ENCOUNTER — Other Ambulatory Visit (HOSPITAL_COMMUNITY): Payer: Self-pay

## 2021-05-21 ENCOUNTER — Other Ambulatory Visit (HOSPITAL_BASED_OUTPATIENT_CLINIC_OR_DEPARTMENT_OTHER): Payer: Self-pay

## 2021-05-21 MED ORDER — METHYLPHENIDATE HCL ER (OSM) 36 MG PO TBCR
36.0000 mg | EXTENDED_RELEASE_TABLET | Freq: Every morning | ORAL | 0 refills | Status: AC
  Filled 2021-05-21: qty 90, 90d supply, fill #0

## 2021-05-24 ENCOUNTER — Other Ambulatory Visit (HOSPITAL_COMMUNITY): Payer: Self-pay

## 2021-08-26 ENCOUNTER — Other Ambulatory Visit (HOSPITAL_COMMUNITY): Payer: Self-pay

## 2021-08-29 ENCOUNTER — Other Ambulatory Visit (HOSPITAL_COMMUNITY): Payer: Self-pay

## 2021-08-30 ENCOUNTER — Other Ambulatory Visit (HOSPITAL_COMMUNITY): Payer: Self-pay

## 2021-08-30 MED ORDER — MELOXICAM 7.5 MG PO TABS
ORAL_TABLET | ORAL | 1 refills | Status: AC
  Filled 2021-08-30: qty 60, 30d supply, fill #0
  Filled 2021-10-10 – 2021-10-17 (×2): qty 60, 30d supply, fill #1

## 2021-08-30 MED ORDER — TRIAMCINOLONE ACETONIDE 0.1 % EX CREA
TOPICAL_CREAM | CUTANEOUS | 0 refills | Status: AC
  Filled 2021-08-30: qty 60, 14d supply, fill #0

## 2021-09-02 ENCOUNTER — Other Ambulatory Visit (HOSPITAL_COMMUNITY): Payer: Self-pay

## 2021-09-03 ENCOUNTER — Other Ambulatory Visit (HOSPITAL_COMMUNITY): Payer: Self-pay

## 2021-09-04 ENCOUNTER — Other Ambulatory Visit (HOSPITAL_COMMUNITY): Payer: Self-pay

## 2021-09-05 ENCOUNTER — Other Ambulatory Visit (HOSPITAL_COMMUNITY): Payer: Self-pay

## 2021-09-05 MED ORDER — MIRTAZAPINE 30 MG PO TBDP
30.0000 mg | ORAL_TABLET | Freq: Every evening | ORAL | 3 refills | Status: DC
  Filled 2021-09-05: qty 90, 90d supply, fill #0
  Filled 2021-12-09: qty 90, 90d supply, fill #1
  Filled 2022-03-10 – 2022-03-19 (×2): qty 90, 90d supply, fill #2
  Filled 2022-06-23: qty 90, 90d supply, fill #3

## 2021-09-06 ENCOUNTER — Other Ambulatory Visit (HOSPITAL_COMMUNITY): Payer: Self-pay

## 2021-09-10 ENCOUNTER — Other Ambulatory Visit (HOSPITAL_COMMUNITY): Payer: Self-pay

## 2021-09-20 ENCOUNTER — Other Ambulatory Visit (HOSPITAL_BASED_OUTPATIENT_CLINIC_OR_DEPARTMENT_OTHER): Payer: Self-pay

## 2021-09-20 MED ORDER — METHYLPHENIDATE HCL ER (OSM) 36 MG PO TBCR
36.0000 mg | EXTENDED_RELEASE_TABLET | Freq: Every morning | ORAL | 0 refills | Status: AC
  Filled 2021-09-20: qty 90, 90d supply, fill #0

## 2021-10-10 ENCOUNTER — Other Ambulatory Visit (HOSPITAL_COMMUNITY): Payer: Self-pay

## 2021-10-17 ENCOUNTER — Other Ambulatory Visit (HOSPITAL_COMMUNITY): Payer: Self-pay

## 2021-10-17 ENCOUNTER — Other Ambulatory Visit (HOSPITAL_BASED_OUTPATIENT_CLINIC_OR_DEPARTMENT_OTHER): Payer: Self-pay

## 2021-12-03 ENCOUNTER — Other Ambulatory Visit (HOSPITAL_COMMUNITY): Payer: Self-pay

## 2021-12-03 MED ORDER — LOSARTAN POTASSIUM-HCTZ 50-12.5 MG PO TABS
1.0000 | ORAL_TABLET | Freq: Every day | ORAL | 2 refills | Status: DC
  Filled 2021-12-03: qty 90, 90d supply, fill #0
  Filled 2022-02-25: qty 90, 90d supply, fill #1
  Filled 2022-05-28: qty 90, 90d supply, fill #2

## 2021-12-09 ENCOUNTER — Other Ambulatory Visit (HOSPITAL_COMMUNITY): Payer: Self-pay

## 2021-12-10 ENCOUNTER — Other Ambulatory Visit (HOSPITAL_COMMUNITY): Payer: Self-pay

## 2022-01-27 ENCOUNTER — Other Ambulatory Visit (HOSPITAL_BASED_OUTPATIENT_CLINIC_OR_DEPARTMENT_OTHER): Payer: Self-pay

## 2022-01-27 MED ORDER — METHYLPHENIDATE HCL ER (OSM) 36 MG PO TBCR
36.0000 mg | EXTENDED_RELEASE_TABLET | Freq: Every morning | ORAL | 0 refills | Status: AC
  Filled 2022-01-27: qty 90, 90d supply, fill #0

## 2022-02-21 ENCOUNTER — Other Ambulatory Visit (HOSPITAL_COMMUNITY): Payer: Self-pay

## 2022-02-21 DIAGNOSIS — Z1239 Encounter for other screening for malignant neoplasm of breast: Secondary | ICD-10-CM | POA: Diagnosis not present

## 2022-02-21 DIAGNOSIS — M2550 Pain in unspecified joint: Secondary | ICD-10-CM | POA: Diagnosis not present

## 2022-02-21 DIAGNOSIS — Z124 Encounter for screening for malignant neoplasm of cervix: Secondary | ICD-10-CM | POA: Diagnosis not present

## 2022-02-21 DIAGNOSIS — Z1211 Encounter for screening for malignant neoplasm of colon: Secondary | ICD-10-CM | POA: Diagnosis not present

## 2022-02-21 DIAGNOSIS — I10 Essential (primary) hypertension: Secondary | ICD-10-CM | POA: Diagnosis not present

## 2022-02-21 DIAGNOSIS — Z0000 Encounter for general adult medical examination without abnormal findings: Secondary | ICD-10-CM | POA: Diagnosis not present

## 2022-02-21 DIAGNOSIS — E785 Hyperlipidemia, unspecified: Secondary | ICD-10-CM | POA: Diagnosis not present

## 2022-02-21 MED ORDER — MELOXICAM 7.5 MG PO TABS
7.5000 mg | ORAL_TABLET | Freq: Every day | ORAL | 3 refills | Status: AC | PRN
  Filled 2022-02-21 (×2): qty 60, 30d supply, fill #0

## 2022-02-25 ENCOUNTER — Other Ambulatory Visit: Payer: Self-pay

## 2022-02-26 ENCOUNTER — Other Ambulatory Visit: Payer: Self-pay

## 2022-03-03 ENCOUNTER — Other Ambulatory Visit (HOSPITAL_COMMUNITY): Payer: Self-pay

## 2022-03-10 ENCOUNTER — Other Ambulatory Visit: Payer: Self-pay

## 2022-03-10 ENCOUNTER — Other Ambulatory Visit (HOSPITAL_COMMUNITY): Payer: Self-pay

## 2022-03-11 ENCOUNTER — Other Ambulatory Visit (HOSPITAL_COMMUNITY): Payer: Self-pay

## 2022-03-14 ENCOUNTER — Other Ambulatory Visit: Payer: Self-pay

## 2022-03-19 ENCOUNTER — Other Ambulatory Visit (HOSPITAL_COMMUNITY): Payer: Self-pay

## 2022-03-19 ENCOUNTER — Other Ambulatory Visit: Payer: Self-pay

## 2022-05-28 ENCOUNTER — Other Ambulatory Visit (HOSPITAL_COMMUNITY): Payer: Self-pay

## 2022-06-11 ENCOUNTER — Other Ambulatory Visit (HOSPITAL_COMMUNITY): Payer: Self-pay

## 2022-06-11 MED ORDER — METHYLPHENIDATE HCL ER (OSM) 36 MG PO TBCR
36.0000 mg | EXTENDED_RELEASE_TABLET | Freq: Every morning | ORAL | 0 refills | Status: AC
  Filled 2022-06-11: qty 90, 90d supply, fill #0

## 2022-06-24 ENCOUNTER — Other Ambulatory Visit: Payer: Self-pay

## 2022-08-27 ENCOUNTER — Other Ambulatory Visit (HOSPITAL_COMMUNITY): Payer: Self-pay

## 2022-08-27 MED ORDER — LOSARTAN POTASSIUM-HCTZ 50-12.5 MG PO TABS
1.0000 | ORAL_TABLET | Freq: Every day | ORAL | 2 refills | Status: DC
  Filled 2022-08-27 – 2022-08-28 (×2): qty 90, 90d supply, fill #0
  Filled 2022-11-27 – 2022-12-01 (×2): qty 90, 90d supply, fill #1
  Filled 2023-02-20 – 2023-03-02 (×3): qty 90, 90d supply, fill #2

## 2022-08-28 ENCOUNTER — Other Ambulatory Visit: Payer: Self-pay

## 2022-08-28 ENCOUNTER — Other Ambulatory Visit (HOSPITAL_COMMUNITY): Payer: Self-pay

## 2022-09-24 ENCOUNTER — Other Ambulatory Visit: Payer: Self-pay | Admitting: Family Medicine

## 2022-09-24 DIAGNOSIS — Z1231 Encounter for screening mammogram for malignant neoplasm of breast: Secondary | ICD-10-CM

## 2022-10-07 ENCOUNTER — Other Ambulatory Visit (HOSPITAL_COMMUNITY): Payer: Self-pay

## 2022-10-20 ENCOUNTER — Other Ambulatory Visit (HOSPITAL_COMMUNITY): Payer: Self-pay

## 2022-10-20 MED ORDER — METHYLPHENIDATE HCL ER (OSM) 36 MG PO TBCR
36.0000 mg | EXTENDED_RELEASE_TABLET | Freq: Every morning | ORAL | 0 refills | Status: AC
  Filled 2022-10-20: qty 90, 90d supply, fill #0

## 2022-10-20 MED ORDER — MIRTAZAPINE 30 MG PO TBDP
30.0000 mg | ORAL_TABLET | Freq: Every evening | ORAL | 1 refills | Status: AC
  Filled 2022-10-20: qty 90, 90d supply, fill #0
  Filled 2023-01-14 – 2023-01-21 (×3): qty 90, 90d supply, fill #1

## 2022-10-23 ENCOUNTER — Other Ambulatory Visit (HOSPITAL_COMMUNITY): Payer: Self-pay

## 2022-10-23 MED ORDER — MIRTAZAPINE 30 MG PO TBDP
30.0000 mg | ORAL_TABLET | Freq: Every evening | ORAL | 3 refills | Status: AC
  Filled 2022-10-23 – 2023-04-14 (×2): qty 90, 90d supply, fill #0
  Filled 2023-07-15: qty 90, 90d supply, fill #1
  Filled 2023-10-13 (×2): qty 90, 90d supply, fill #2

## 2022-11-11 ENCOUNTER — Other Ambulatory Visit: Payer: Self-pay | Admitting: Family Medicine

## 2022-11-11 ENCOUNTER — Inpatient Hospital Stay
Admission: RE | Admit: 2022-11-11 | Discharge: 2022-11-11 | Disposition: A | Discharge status: Home / Self Care | Payer: Commercial Managed Care - PPO | Source: Ambulatory Visit | Attending: Family Medicine | Admitting: Family Medicine

## 2022-11-11 DIAGNOSIS — Z1231 Encounter for screening mammogram for malignant neoplasm of breast: Secondary | ICD-10-CM

## 2022-11-27 ENCOUNTER — Other Ambulatory Visit (HOSPITAL_COMMUNITY): Payer: Self-pay

## 2022-11-28 ENCOUNTER — Other Ambulatory Visit: Payer: Self-pay

## 2022-12-01 ENCOUNTER — Other Ambulatory Visit (HOSPITAL_COMMUNITY): Payer: Self-pay

## 2022-12-03 ENCOUNTER — Other Ambulatory Visit: Payer: Self-pay

## 2023-01-14 ENCOUNTER — Other Ambulatory Visit (HOSPITAL_COMMUNITY): Payer: Self-pay

## 2023-01-14 ENCOUNTER — Other Ambulatory Visit: Payer: Self-pay

## 2023-01-15 ENCOUNTER — Encounter: Payer: Self-pay | Admitting: Pharmacist

## 2023-01-15 ENCOUNTER — Other Ambulatory Visit: Payer: Self-pay

## 2023-01-20 ENCOUNTER — Other Ambulatory Visit: Payer: Self-pay

## 2023-01-21 ENCOUNTER — Other Ambulatory Visit (HOSPITAL_COMMUNITY): Payer: Self-pay

## 2023-01-21 ENCOUNTER — Other Ambulatory Visit: Payer: Self-pay

## 2023-01-23 ENCOUNTER — Other Ambulatory Visit (HOSPITAL_COMMUNITY): Payer: Self-pay

## 2023-02-20 ENCOUNTER — Other Ambulatory Visit (HOSPITAL_COMMUNITY): Payer: Self-pay

## 2023-02-25 ENCOUNTER — Other Ambulatory Visit (HOSPITAL_COMMUNITY): Payer: Self-pay

## 2023-02-25 ENCOUNTER — Other Ambulatory Visit: Payer: Self-pay

## 2023-02-25 MED ORDER — MELOXICAM 7.5 MG PO TABS
7.5000 mg | ORAL_TABLET | Freq: Every day | ORAL | 3 refills | Status: AC | PRN
  Filled 2023-02-25: qty 60, 30d supply, fill #0
  Filled 2023-08-21 – 2023-08-28 (×2): qty 60, 30d supply, fill #1

## 2023-02-27 ENCOUNTER — Encounter: Payer: Self-pay | Admitting: Family Medicine

## 2023-02-27 ENCOUNTER — Other Ambulatory Visit: Payer: Self-pay | Admitting: Family Medicine

## 2023-02-27 DIAGNOSIS — E785 Hyperlipidemia, unspecified: Secondary | ICD-10-CM

## 2023-03-02 ENCOUNTER — Other Ambulatory Visit (HOSPITAL_COMMUNITY): Payer: Self-pay

## 2023-03-02 MED ORDER — METHYLPHENIDATE HCL ER (OSM) 36 MG PO TBCR
36.0000 mg | EXTENDED_RELEASE_TABLET | Freq: Every morning | ORAL | 0 refills | Status: AC
  Filled 2023-03-02: qty 90, 90d supply, fill #0

## 2023-03-10 ENCOUNTER — Ambulatory Visit (INDEPENDENT_AMBULATORY_CARE_PROVIDER_SITE_OTHER): Payer: Self-pay

## 2023-03-10 DIAGNOSIS — E785 Hyperlipidemia, unspecified: Secondary | ICD-10-CM

## 2023-04-14 ENCOUNTER — Other Ambulatory Visit (HOSPITAL_COMMUNITY): Payer: Self-pay

## 2023-04-29 ENCOUNTER — Ambulatory Visit: Payer: Self-pay | Admitting: Psychiatry

## 2023-05-19 ENCOUNTER — Other Ambulatory Visit (HOSPITAL_COMMUNITY): Payer: Self-pay

## 2023-05-19 ENCOUNTER — Other Ambulatory Visit: Payer: Self-pay

## 2023-05-19 MED ORDER — LOSARTAN POTASSIUM-HCTZ 50-12.5 MG PO TABS
1.0000 | ORAL_TABLET | Freq: Every day | ORAL | 2 refills | Status: DC
  Filled 2023-05-19: qty 90, 90d supply, fill #0
  Filled 2023-08-21 – 2023-08-28 (×2): qty 90, 90d supply, fill #1
  Filled 2023-11-26: qty 90, 90d supply, fill #2

## 2023-07-01 ENCOUNTER — Other Ambulatory Visit (HOSPITAL_BASED_OUTPATIENT_CLINIC_OR_DEPARTMENT_OTHER): Payer: Self-pay

## 2023-07-01 MED ORDER — METHYLPHENIDATE HCL ER (OSM) 36 MG PO TBCR
36.0000 mg | EXTENDED_RELEASE_TABLET | Freq: Every morning | ORAL | 0 refills | Status: DC
  Filled 2023-07-01 (×2): qty 90, 90d supply, fill #0

## 2023-07-02 ENCOUNTER — Other Ambulatory Visit (HOSPITAL_BASED_OUTPATIENT_CLINIC_OR_DEPARTMENT_OTHER): Payer: Self-pay

## 2023-07-15 ENCOUNTER — Other Ambulatory Visit (HOSPITAL_COMMUNITY): Payer: Self-pay

## 2023-07-15 ENCOUNTER — Other Ambulatory Visit: Payer: Self-pay

## 2023-08-05 ENCOUNTER — Other Ambulatory Visit (HOSPITAL_COMMUNITY): Payer: Self-pay

## 2023-08-05 MED ORDER — METHYLPHENIDATE HCL ER (OSM) 36 MG PO TBCR
36.0000 mg | EXTENDED_RELEASE_TABLET | Freq: Every day | ORAL | 0 refills | Status: AC
  Filled 2023-08-05: qty 90, 90d supply, fill #0

## 2023-08-05 MED ORDER — MIRTAZAPINE 30 MG PO TBDP
30.0000 mg | ORAL_TABLET | Freq: Every evening | ORAL | 1 refills | Status: AC
  Filled 2024-01-18: qty 90, 90d supply, fill #0

## 2023-08-21 ENCOUNTER — Other Ambulatory Visit (HOSPITAL_COMMUNITY): Payer: Self-pay

## 2023-08-21 ENCOUNTER — Other Ambulatory Visit: Payer: Self-pay

## 2023-08-28 ENCOUNTER — Other Ambulatory Visit (HOSPITAL_COMMUNITY): Payer: Self-pay

## 2023-10-13 ENCOUNTER — Other Ambulatory Visit (HOSPITAL_COMMUNITY): Payer: Self-pay

## 2023-10-14 ENCOUNTER — Other Ambulatory Visit: Payer: Self-pay

## 2023-11-04 ENCOUNTER — Other Ambulatory Visit (HOSPITAL_COMMUNITY): Payer: Self-pay

## 2023-11-04 MED ORDER — METHYLPHENIDATE HCL ER (OSM) 36 MG PO TBCR
36.0000 mg | EXTENDED_RELEASE_TABLET | Freq: Every day | ORAL | 0 refills | Status: AC
  Filled 2023-11-04 (×2): qty 90, 90d supply, fill #0

## 2023-11-26 ENCOUNTER — Other Ambulatory Visit (HOSPITAL_COMMUNITY): Payer: Self-pay

## 2024-01-18 ENCOUNTER — Other Ambulatory Visit (HOSPITAL_COMMUNITY): Payer: Self-pay

## 2024-01-19 ENCOUNTER — Other Ambulatory Visit (HOSPITAL_COMMUNITY): Payer: Self-pay

## 2024-01-27 ENCOUNTER — Other Ambulatory Visit (HOSPITAL_COMMUNITY): Payer: Self-pay

## 2024-01-27 ENCOUNTER — Other Ambulatory Visit: Payer: Self-pay

## 2024-01-27 MED ORDER — METHYLPHENIDATE HCL ER (OSM) 36 MG PO TBCR
36.0000 mg | EXTENDED_RELEASE_TABLET | Freq: Every day | ORAL | 0 refills | Status: AC
  Filled 2024-01-27 – 2024-01-31 (×2): qty 90, 90d supply, fill #0

## 2024-01-27 MED ORDER — MIRTAZAPINE 30 MG PO TBDP
30.0000 mg | ORAL_TABLET | Freq: Every day | ORAL | 1 refills | Status: AC
  Filled 2024-01-27: qty 30, 30d supply, fill #0
  Filled 2024-01-28: qty 90, 90d supply, fill #0
  Filled 2024-01-29: qty 2700, fill #0
  Filled 2024-01-30: qty 2700, 270d supply, fill #0
  Filled 2024-02-02: qty 2700, fill #0
  Filled 2024-02-03 – 2024-02-06 (×4): qty 90, 90d supply, fill #0

## 2024-01-28 ENCOUNTER — Other Ambulatory Visit (HOSPITAL_COMMUNITY): Payer: Self-pay

## 2024-01-29 ENCOUNTER — Other Ambulatory Visit (HOSPITAL_COMMUNITY): Payer: Self-pay

## 2024-01-30 ENCOUNTER — Other Ambulatory Visit (HOSPITAL_COMMUNITY): Payer: Self-pay

## 2024-01-31 ENCOUNTER — Other Ambulatory Visit (HOSPITAL_COMMUNITY): Payer: Self-pay

## 2024-02-01 ENCOUNTER — Other Ambulatory Visit (HOSPITAL_COMMUNITY): Payer: Self-pay

## 2024-02-01 ENCOUNTER — Other Ambulatory Visit: Payer: Self-pay

## 2024-02-02 ENCOUNTER — Other Ambulatory Visit (HOSPITAL_COMMUNITY): Payer: Self-pay

## 2024-02-03 ENCOUNTER — Other Ambulatory Visit: Payer: Self-pay

## 2024-02-03 ENCOUNTER — Other Ambulatory Visit (HOSPITAL_COMMUNITY): Payer: Self-pay

## 2024-02-04 ENCOUNTER — Other Ambulatory Visit (HOSPITAL_COMMUNITY): Payer: Self-pay

## 2024-02-05 ENCOUNTER — Other Ambulatory Visit: Payer: Self-pay

## 2024-02-05 ENCOUNTER — Other Ambulatory Visit (HOSPITAL_COMMUNITY): Payer: Self-pay

## 2024-02-06 ENCOUNTER — Other Ambulatory Visit (HOSPITAL_COMMUNITY): Payer: Self-pay

## 2024-02-06 ENCOUNTER — Other Ambulatory Visit: Payer: Self-pay

## 2024-02-07 ENCOUNTER — Other Ambulatory Visit (HOSPITAL_COMMUNITY): Payer: Self-pay

## 2024-02-20 ENCOUNTER — Other Ambulatory Visit (HOSPITAL_COMMUNITY): Payer: Self-pay

## 2024-02-24 ENCOUNTER — Other Ambulatory Visit (HOSPITAL_COMMUNITY): Payer: Self-pay

## 2024-02-24 ENCOUNTER — Other Ambulatory Visit: Payer: Self-pay

## 2024-02-24 MED ORDER — LOSARTAN POTASSIUM-HCTZ 50-12.5 MG PO TABS
1.0000 | ORAL_TABLET | Freq: Every day | ORAL | 0 refills | Status: AC
  Filled 2024-02-24: qty 90, 90d supply, fill #0
